# Patient Record
Sex: Female | Born: 1950 | Race: White | Hispanic: No | Marital: Married | Smoking: Former smoker
Health system: Southern US, Community
[De-identification: ages and names within clinical notes are randomized; demographics above are authoritative.]

## PROBLEM LIST (undated history)

## (undated) DIAGNOSIS — S4990XA Unspecified injury of shoulder and upper arm, unspecified arm, initial encounter: Secondary | ICD-10-CM

## (undated) DIAGNOSIS — E039 Hypothyroidism, unspecified: Secondary | ICD-10-CM

## (undated) DIAGNOSIS — E7800 Pure hypercholesterolemia, unspecified: Secondary | ICD-10-CM

## (undated) DIAGNOSIS — M810 Age-related osteoporosis without current pathological fracture: Secondary | ICD-10-CM

## (undated) DIAGNOSIS — C4491 Basal cell carcinoma of skin, unspecified: Secondary | ICD-10-CM

## (undated) HISTORY — DX: Basal cell carcinoma of skin, unspecified: C44.91

## (undated) HISTORY — PX: CATARACT EXTRACTION: SUR2

## (undated) HISTORY — DX: Unspecified injury of shoulder and upper arm, unspecified arm, initial encounter: S49.90XA

## (undated) HISTORY — DX: Hypothyroidism, unspecified: E03.9

## (undated) HISTORY — DX: Pure hypercholesterolemia, unspecified: E78.00

## (undated) HISTORY — DX: Age-related osteoporosis without current pathological fracture: M81.0

## (undated) HISTORY — PX: TONSILLECTOMY: SUR1361

---

## 1999-08-25 ENCOUNTER — Encounter: Admission: RE | Admit: 1999-08-25 | Discharge: 1999-08-25 | Payer: Self-pay | Admitting: Obstetrics and Gynecology

## 1999-08-25 ENCOUNTER — Encounter: Payer: Self-pay | Admitting: Obstetrics and Gynecology

## 2000-08-28 ENCOUNTER — Encounter: Payer: Self-pay | Admitting: Obstetrics and Gynecology

## 2000-08-28 ENCOUNTER — Encounter: Admission: RE | Admit: 2000-08-28 | Discharge: 2000-08-28 | Payer: Self-pay | Admitting: Obstetrics and Gynecology

## 2001-09-11 ENCOUNTER — Encounter: Payer: Self-pay | Admitting: Obstetrics and Gynecology

## 2001-09-11 ENCOUNTER — Encounter: Admission: RE | Admit: 2001-09-11 | Discharge: 2001-09-11 | Payer: Self-pay | Admitting: Obstetrics and Gynecology

## 2002-10-12 ENCOUNTER — Encounter: Payer: Self-pay | Admitting: Obstetrics and Gynecology

## 2002-10-12 ENCOUNTER — Ambulatory Visit (HOSPITAL_COMMUNITY): Admission: RE | Admit: 2002-10-12 | Discharge: 2002-10-12 | Payer: Self-pay | Admitting: Obstetrics and Gynecology

## 2003-10-26 ENCOUNTER — Ambulatory Visit (HOSPITAL_COMMUNITY): Admission: RE | Admit: 2003-10-26 | Discharge: 2003-10-26 | Payer: Self-pay | Admitting: Obstetrics and Gynecology

## 2004-12-05 ENCOUNTER — Ambulatory Visit (HOSPITAL_COMMUNITY): Admission: RE | Admit: 2004-12-05 | Discharge: 2004-12-05 | Payer: Self-pay | Admitting: Obstetrics and Gynecology

## 2005-12-26 ENCOUNTER — Ambulatory Visit (HOSPITAL_COMMUNITY): Admission: RE | Admit: 2005-12-26 | Discharge: 2005-12-26 | Payer: Self-pay | Admitting: Obstetrics and Gynecology

## 2007-01-01 ENCOUNTER — Ambulatory Visit (HOSPITAL_COMMUNITY): Admission: RE | Admit: 2007-01-01 | Discharge: 2007-01-01 | Payer: Self-pay | Admitting: Obstetrics and Gynecology

## 2008-01-07 ENCOUNTER — Ambulatory Visit (HOSPITAL_COMMUNITY): Admission: RE | Admit: 2008-01-07 | Discharge: 2008-01-07 | Payer: Self-pay | Admitting: Obstetrics and Gynecology

## 2009-01-06 ENCOUNTER — Ambulatory Visit (HOSPITAL_COMMUNITY): Admission: RE | Admit: 2009-01-06 | Discharge: 2009-01-06 | Payer: Self-pay | Admitting: Family Medicine

## 2009-06-08 ENCOUNTER — Encounter: Admission: RE | Admit: 2009-06-08 | Discharge: 2009-06-08 | Payer: Self-pay | Admitting: Family Medicine

## 2010-01-10 ENCOUNTER — Ambulatory Visit (HOSPITAL_COMMUNITY): Admission: RE | Admit: 2010-01-10 | Discharge: 2010-01-10 | Payer: Self-pay | Admitting: Internal Medicine

## 2010-05-21 ENCOUNTER — Encounter: Payer: Self-pay | Admitting: Obstetrics and Gynecology

## 2010-12-25 ENCOUNTER — Other Ambulatory Visit (HOSPITAL_COMMUNITY): Payer: Self-pay | Admitting: Family Medicine

## 2010-12-25 DIAGNOSIS — Z1231 Encounter for screening mammogram for malignant neoplasm of breast: Secondary | ICD-10-CM

## 2011-01-18 ENCOUNTER — Ambulatory Visit (HOSPITAL_COMMUNITY): Payer: Self-pay

## 2011-01-23 ENCOUNTER — Ambulatory Visit (HOSPITAL_COMMUNITY)
Admission: RE | Admit: 2011-01-23 | Discharge: 2011-01-23 | Disposition: A | Discharge status: Home / Self Care | Payer: 59 | Source: Ambulatory Visit | Attending: Family Medicine | Admitting: Family Medicine

## 2011-01-23 DIAGNOSIS — Z1231 Encounter for screening mammogram for malignant neoplasm of breast: Secondary | ICD-10-CM | POA: Insufficient documentation

## 2011-01-29 ENCOUNTER — Other Ambulatory Visit: Payer: Self-pay | Admitting: Family Medicine

## 2011-01-29 DIAGNOSIS — R928 Other abnormal and inconclusive findings on diagnostic imaging of breast: Secondary | ICD-10-CM

## 2011-02-07 ENCOUNTER — Ambulatory Visit
Admission: RE | Admit: 2011-02-07 | Discharge: 2011-02-07 | Disposition: A | Discharge status: Home / Self Care | Payer: 59 | Source: Ambulatory Visit | Attending: Family Medicine | Admitting: Family Medicine

## 2011-02-07 ENCOUNTER — Other Ambulatory Visit: Payer: Self-pay | Admitting: Family Medicine

## 2011-02-07 DIAGNOSIS — R928 Other abnormal and inconclusive findings on diagnostic imaging of breast: Secondary | ICD-10-CM

## 2011-12-19 ENCOUNTER — Other Ambulatory Visit: Payer: Self-pay | Admitting: Family Medicine

## 2011-12-19 DIAGNOSIS — Z1231 Encounter for screening mammogram for malignant neoplasm of breast: Secondary | ICD-10-CM

## 2012-01-24 ENCOUNTER — Ambulatory Visit
Admission: RE | Admit: 2012-01-24 | Discharge: 2012-01-24 | Disposition: A | Discharge status: Home / Self Care | Payer: 59 | Source: Ambulatory Visit | Attending: Family Medicine | Admitting: Family Medicine

## 2012-01-24 DIAGNOSIS — Z1231 Encounter for screening mammogram for malignant neoplasm of breast: Secondary | ICD-10-CM

## 2012-05-22 ENCOUNTER — Other Ambulatory Visit: Payer: Self-pay | Admitting: Family Medicine

## 2012-05-22 DIAGNOSIS — Z780 Asymptomatic menopausal state: Secondary | ICD-10-CM

## 2012-05-28 ENCOUNTER — Other Ambulatory Visit: Payer: Self-pay

## 2012-06-02 ENCOUNTER — Ambulatory Visit
Admission: RE | Admit: 2012-06-02 | Discharge: 2012-06-02 | Disposition: A | Discharge status: Home / Self Care | Payer: Self-pay | Source: Ambulatory Visit | Attending: Family Medicine | Admitting: Family Medicine

## 2012-06-02 DIAGNOSIS — Z780 Asymptomatic menopausal state: Secondary | ICD-10-CM

## 2012-12-22 ENCOUNTER — Other Ambulatory Visit: Payer: Self-pay

## 2012-12-22 DIAGNOSIS — Z1231 Encounter for screening mammogram for malignant neoplasm of breast: Secondary | ICD-10-CM

## 2013-01-26 ENCOUNTER — Ambulatory Visit
Admission: RE | Admit: 2013-01-26 | Discharge: 2013-01-26 | Disposition: A | Discharge status: Home / Self Care | Payer: 59 | Source: Ambulatory Visit

## 2013-01-26 DIAGNOSIS — Z1231 Encounter for screening mammogram for malignant neoplasm of breast: Secondary | ICD-10-CM

## 2013-12-29 ENCOUNTER — Other Ambulatory Visit: Payer: Self-pay

## 2013-12-29 DIAGNOSIS — Z1231 Encounter for screening mammogram for malignant neoplasm of breast: Secondary | ICD-10-CM

## 2014-01-08 ENCOUNTER — Ambulatory Visit
Admission: RE | Admit: 2014-01-08 | Discharge: 2014-01-08 | Disposition: A | Discharge status: Home / Self Care | Payer: 59 | Source: Ambulatory Visit

## 2014-01-08 DIAGNOSIS — Z1231 Encounter for screening mammogram for malignant neoplasm of breast: Secondary | ICD-10-CM

## 2014-07-22 ENCOUNTER — Other Ambulatory Visit: Payer: Self-pay | Admitting: Family Medicine

## 2014-07-22 DIAGNOSIS — M8580 Other specified disorders of bone density and structure, unspecified site: Secondary | ICD-10-CM

## 2014-08-04 ENCOUNTER — Ambulatory Visit
Admission: RE | Admit: 2014-08-04 | Discharge: 2014-08-04 | Disposition: A | Discharge status: Home / Self Care | Payer: 59 | Source: Ambulatory Visit | Attending: Family Medicine | Admitting: Family Medicine

## 2014-08-04 DIAGNOSIS — M8580 Other specified disorders of bone density and structure, unspecified site: Secondary | ICD-10-CM

## 2014-12-30 ENCOUNTER — Other Ambulatory Visit: Payer: Self-pay

## 2014-12-30 DIAGNOSIS — Z1231 Encounter for screening mammogram for malignant neoplasm of breast: Secondary | ICD-10-CM

## 2015-02-07 ENCOUNTER — Ambulatory Visit
Admission: RE | Admit: 2015-02-07 | Discharge: 2015-02-07 | Disposition: A | Discharge status: Home / Self Care | Payer: 59 | Source: Ambulatory Visit

## 2015-02-07 DIAGNOSIS — Z1231 Encounter for screening mammogram for malignant neoplasm of breast: Secondary | ICD-10-CM

## 2015-05-17 DIAGNOSIS — Z6829 Body mass index (BMI) 29.0-29.9, adult: Secondary | ICD-10-CM | POA: Diagnosis not present

## 2015-05-17 DIAGNOSIS — E559 Vitamin D deficiency, unspecified: Secondary | ICD-10-CM | POA: Diagnosis not present

## 2015-05-17 DIAGNOSIS — E7800 Pure hypercholesterolemia, unspecified: Secondary | ICD-10-CM | POA: Diagnosis not present

## 2015-05-17 DIAGNOSIS — E039 Hypothyroidism, unspecified: Secondary | ICD-10-CM | POA: Diagnosis not present

## 2015-06-14 MED FILL — PRAVASTATIN NA 40 MG TAB: 40 | 90 days supply | Qty: 90 | Fill #3

## 2015-06-14 MED FILL — LEVOTHYROXINE 50 MCG TABLET: 50 | 90 days supply | Qty: 90 | Fill #3

## 2015-07-07 DIAGNOSIS — Z85828 Personal history of other malignant neoplasm of skin: Secondary | ICD-10-CM | POA: Diagnosis not present

## 2015-07-07 DIAGNOSIS — L820 Inflamed seborrheic keratosis: Secondary | ICD-10-CM | POA: Diagnosis not present

## 2015-09-21 MED FILL — LEVOTHYROXINE 50 MCG TABLET: 50 | 90 days supply | Qty: 90 | Fill #0

## 2015-09-21 MED FILL — PRAVASTATIN NA 40 MG TAB: 40 | 90 days supply | Qty: 90 | Fill #0

## 2015-12-19 MED FILL — LEVOTHYROXINE 50 MCG TABLET: 50 | 90 days supply | Qty: 90 | Fill #1

## 2015-12-19 MED FILL — PRAVASTATIN NA 40 MG TAB: 40 | 90 days supply | Qty: 90 | Fill #1

## 2016-01-18 ENCOUNTER — Other Ambulatory Visit: Payer: Self-pay | Admitting: Family Medicine

## 2016-01-18 DIAGNOSIS — Z1231 Encounter for screening mammogram for malignant neoplasm of breast: Secondary | ICD-10-CM

## 2016-02-13 ENCOUNTER — Ambulatory Visit
Admission: RE | Admit: 2016-02-13 | Discharge: 2016-02-13 | Disposition: A | Discharge status: Home / Self Care | Payer: 59 | Source: Ambulatory Visit | Attending: Family Medicine | Admitting: Family Medicine

## 2016-02-13 ENCOUNTER — Other Ambulatory Visit: Payer: Self-pay | Admitting: Nurse Practitioner

## 2016-02-13 DIAGNOSIS — Z1231 Encounter for screening mammogram for malignant neoplasm of breast: Secondary | ICD-10-CM

## 2016-03-02 DIAGNOSIS — D1801 Hemangioma of skin and subcutaneous tissue: Secondary | ICD-10-CM | POA: Diagnosis not present

## 2016-03-02 DIAGNOSIS — L821 Other seborrheic keratosis: Secondary | ICD-10-CM | POA: Diagnosis not present

## 2016-03-02 DIAGNOSIS — Z85828 Personal history of other malignant neoplasm of skin: Secondary | ICD-10-CM | POA: Diagnosis not present

## 2016-03-02 DIAGNOSIS — D225 Melanocytic nevi of trunk: Secondary | ICD-10-CM | POA: Diagnosis not present

## 2016-03-02 DIAGNOSIS — L814 Other melanin hyperpigmentation: Secondary | ICD-10-CM | POA: Diagnosis not present

## 2016-03-19 MED FILL — PRAVASTATIN NA 40 MG TAB: 40 | 90 days supply | Qty: 90 | Fill #2

## 2016-03-19 MED FILL — LEVOTHYROXINE 50 MCG TABLET: 50 | 90 days supply | Qty: 90 | Fill #2

## 2016-05-21 DIAGNOSIS — E559 Vitamin D deficiency, unspecified: Secondary | ICD-10-CM | POA: Diagnosis not present

## 2016-05-21 DIAGNOSIS — M8580 Other specified disorders of bone density and structure, unspecified site: Secondary | ICD-10-CM | POA: Diagnosis not present

## 2016-05-21 DIAGNOSIS — Z9181 History of falling: Secondary | ICD-10-CM | POA: Diagnosis not present

## 2016-05-21 DIAGNOSIS — Z1389 Encounter for screening for other disorder: Secondary | ICD-10-CM | POA: Diagnosis not present

## 2016-05-21 DIAGNOSIS — E039 Hypothyroidism, unspecified: Secondary | ICD-10-CM | POA: Diagnosis not present

## 2016-05-21 DIAGNOSIS — E7800 Pure hypercholesterolemia, unspecified: Secondary | ICD-10-CM | POA: Diagnosis not present

## 2016-05-21 DIAGNOSIS — E663 Overweight: Secondary | ICD-10-CM | POA: Diagnosis not present

## 2016-05-21 DIAGNOSIS — Z79899 Other long term (current) drug therapy: Secondary | ICD-10-CM | POA: Diagnosis not present

## 2016-05-21 DIAGNOSIS — Z6827 Body mass index (BMI) 27.0-27.9, adult: Secondary | ICD-10-CM | POA: Diagnosis not present

## 2016-06-07 DIAGNOSIS — H5213 Myopia, bilateral: Secondary | ICD-10-CM | POA: Diagnosis not present

## 2016-06-18 MED FILL — PRAVASTATIN NA 40 MG TAB: 40 | 90 days supply | Qty: 90 | Fill #3

## 2016-06-18 MED FILL — LEVOTHYROXINE 50 MCG TABLET: 50 | 90 days supply | Qty: 90 | Fill #3

## 2016-09-18 MED FILL — LEVOTHYROXINE 50 MCG TABLET: 50 | 90 days supply | Qty: 90 | Fill #0

## 2016-09-18 MED FILL — PRAVASTATIN NA 40 MG TAB: 40 | 90 days supply | Qty: 90 | Fill #0

## 2016-11-23 ENCOUNTER — Other Ambulatory Visit: Payer: Self-pay | Admitting: Nurse Practitioner

## 2016-11-23 DIAGNOSIS — E039 Hypothyroidism, unspecified: Secondary | ICD-10-CM | POA: Diagnosis not present

## 2016-11-23 DIAGNOSIS — Z6825 Body mass index (BMI) 25.0-25.9, adult: Secondary | ICD-10-CM | POA: Diagnosis not present

## 2016-11-23 DIAGNOSIS — E7800 Pure hypercholesterolemia, unspecified: Secondary | ICD-10-CM | POA: Diagnosis not present

## 2016-11-23 DIAGNOSIS — E663 Overweight: Secondary | ICD-10-CM | POA: Diagnosis not present

## 2016-11-23 DIAGNOSIS — M8580 Other specified disorders of bone density and structure, unspecified site: Secondary | ICD-10-CM

## 2016-11-23 DIAGNOSIS — E559 Vitamin D deficiency, unspecified: Secondary | ICD-10-CM | POA: Diagnosis not present

## 2016-12-17 MED FILL — PRAVASTATIN NA 40 MG TAB: 40 | 90 days supply | Qty: 90 | Fill #1

## 2016-12-17 MED FILL — LEVOTHYROXINE 50 MCG TABLET: 50 | 90 days supply | Qty: 90 | Fill #1

## 2017-02-13 ENCOUNTER — Ambulatory Visit
Admission: RE | Admit: 2017-02-13 | Discharge: 2017-02-13 | Disposition: A | Discharge status: Home / Self Care | Payer: 59 | Source: Ambulatory Visit | Attending: Nurse Practitioner | Admitting: Nurse Practitioner

## 2017-02-13 DIAGNOSIS — M810 Age-related osteoporosis without current pathological fracture: Secondary | ICD-10-CM | POA: Diagnosis not present

## 2017-02-13 DIAGNOSIS — Z780 Asymptomatic menopausal state: Secondary | ICD-10-CM | POA: Diagnosis not present

## 2017-02-13 DIAGNOSIS — Z1231 Encounter for screening mammogram for malignant neoplasm of breast: Secondary | ICD-10-CM | POA: Diagnosis not present

## 2017-02-13 DIAGNOSIS — M8580 Other specified disorders of bone density and structure, unspecified site: Secondary | ICD-10-CM

## 2017-03-01 DIAGNOSIS — E663 Overweight: Secondary | ICD-10-CM | POA: Diagnosis not present

## 2017-03-01 DIAGNOSIS — E039 Hypothyroidism, unspecified: Secondary | ICD-10-CM | POA: Diagnosis not present

## 2017-03-01 DIAGNOSIS — Z6825 Body mass index (BMI) 25.0-25.9, adult: Secondary | ICD-10-CM | POA: Diagnosis not present

## 2017-03-01 DIAGNOSIS — M810 Age-related osteoporosis without current pathological fracture: Secondary | ICD-10-CM | POA: Diagnosis not present

## 2017-03-01 MED FILL — ALENDRONATE NA 70 MG TAB: 70 | 28 days supply | Qty: 4 | Fill #0

## 2017-03-07 DIAGNOSIS — D1801 Hemangioma of skin and subcutaneous tissue: Secondary | ICD-10-CM | POA: Diagnosis not present

## 2017-03-07 DIAGNOSIS — L820 Inflamed seborrheic keratosis: Secondary | ICD-10-CM | POA: Diagnosis not present

## 2017-03-07 DIAGNOSIS — L814 Other melanin hyperpigmentation: Secondary | ICD-10-CM | POA: Diagnosis not present

## 2017-03-07 DIAGNOSIS — Z85828 Personal history of other malignant neoplasm of skin: Secondary | ICD-10-CM | POA: Diagnosis not present

## 2017-03-07 DIAGNOSIS — L821 Other seborrheic keratosis: Secondary | ICD-10-CM | POA: Diagnosis not present

## 2017-03-07 DIAGNOSIS — D225 Melanocytic nevi of trunk: Secondary | ICD-10-CM | POA: Diagnosis not present

## 2017-03-18 MED FILL — LEVOTHYROXINE 50 MCG TABLET: 50 | 90 days supply | Qty: 90 | Fill #2

## 2017-03-18 MED FILL — PRAVASTATIN NA 40 MG TAB: 40 | 90 days supply | Qty: 90 | Fill #2

## 2017-04-08 MED FILL — ALENDRONATE NA 70 MG TAB: 70 | 28 days supply | Qty: 4 | Fill #1

## 2017-05-06 MED FILL — ALENDRONATE NA 70 MG TAB: 70 | 28 days supply | Qty: 4 | Fill #2

## 2017-05-24 DIAGNOSIS — Z6826 Body mass index (BMI) 26.0-26.9, adult: Secondary | ICD-10-CM | POA: Diagnosis not present

## 2017-05-24 DIAGNOSIS — M810 Age-related osteoporosis without current pathological fracture: Secondary | ICD-10-CM | POA: Diagnosis not present

## 2017-05-24 DIAGNOSIS — E039 Hypothyroidism, unspecified: Secondary | ICD-10-CM | POA: Diagnosis not present

## 2017-05-24 DIAGNOSIS — E7800 Pure hypercholesterolemia, unspecified: Secondary | ICD-10-CM | POA: Diagnosis not present

## 2017-05-24 DIAGNOSIS — Z1331 Encounter for screening for depression: Secondary | ICD-10-CM | POA: Diagnosis not present

## 2017-05-24 DIAGNOSIS — E559 Vitamin D deficiency, unspecified: Secondary | ICD-10-CM | POA: Diagnosis not present

## 2017-06-03 MED FILL — ALENDRONATE NA 70 MG TAB: 70 | 84 days supply | Qty: 12 | Fill #0

## 2017-06-13 MED FILL — PRAVASTATIN NA 40 MG TAB: 40 | 90 days supply | Qty: 90 | Fill #3

## 2017-06-13 MED FILL — LEVOTHYROXINE 50 MCG TABLET: 50 | 90 days supply | Qty: 90 | Fill #3

## 2017-09-10 MED FILL — ALENDRONATE NA 70 MG TAB: 70 | 84 days supply | Qty: 12 | Fill #1

## 2017-09-10 MED FILL — LEVOTHYROXINE 50 MCG TABLET: 50 | 90 days supply | Qty: 90 | Fill #0

## 2017-09-10 MED FILL — PRAVASTATIN NA 40 MG TAB: 40 | 90 days supply | Qty: 90 | Fill #0

## 2017-10-14 DIAGNOSIS — Z6826 Body mass index (BMI) 26.0-26.9, adult: Secondary | ICD-10-CM | POA: Diagnosis not present

## 2017-10-14 DIAGNOSIS — Z0000 Encounter for general adult medical examination without abnormal findings: Secondary | ICD-10-CM | POA: Diagnosis not present

## 2017-10-21 DIAGNOSIS — H5213 Myopia, bilateral: Secondary | ICD-10-CM | POA: Diagnosis not present

## 2017-12-02 MED FILL — LEVOTHYROXINE 50 MCG TABLET: 50 | 90 days supply | Qty: 90 | Fill #1

## 2017-12-02 MED FILL — PRAVASTATIN NA 40 MG TAB: 40 | 90 days supply | Qty: 90 | Fill #1

## 2017-12-02 MED FILL — ALENDRONATE NA 70 MG TAB: 70 | 84 days supply | Qty: 12 | Fill #2

## 2017-12-03 MED FILL — FLUOCINONIDE 0.05 % SOLN: 0.05 | 30 days supply | Qty: 60 | Fill #0

## 2018-01-07 ENCOUNTER — Other Ambulatory Visit: Payer: Self-pay | Admitting: Nurse Practitioner

## 2018-01-07 DIAGNOSIS — Z1231 Encounter for screening mammogram for malignant neoplasm of breast: Secondary | ICD-10-CM

## 2018-02-14 ENCOUNTER — Ambulatory Visit
Admission: RE | Admit: 2018-02-14 | Discharge: 2018-02-14 | Disposition: A | Discharge status: Home / Self Care | Payer: 59 | Source: Ambulatory Visit | Attending: Nurse Practitioner | Admitting: Nurse Practitioner

## 2018-02-14 DIAGNOSIS — Z1231 Encounter for screening mammogram for malignant neoplasm of breast: Secondary | ICD-10-CM | POA: Diagnosis not present

## 2018-02-18 ENCOUNTER — Other Ambulatory Visit: Payer: Self-pay | Admitting: Nurse Practitioner

## 2018-02-18 DIAGNOSIS — R928 Other abnormal and inconclusive findings on diagnostic imaging of breast: Secondary | ICD-10-CM

## 2018-02-18 DIAGNOSIS — N6310 Unspecified lump in the right breast, unspecified quadrant: Secondary | ICD-10-CM

## 2018-02-24 MED FILL — LEVOTHYROXINE 50 MCG TABLET: 50 | 90 days supply | Qty: 90 | Fill #2

## 2018-02-24 MED FILL — ALENDRONATE NA 70 MG TAB: 70 | 84 days supply | Qty: 12 | Fill #3

## 2018-02-24 MED FILL — PRAVASTATIN NA 40 MG TAB: 40 | 90 days supply | Qty: 90 | Fill #2

## 2018-02-26 ENCOUNTER — Ambulatory Visit
Admission: RE | Admit: 2018-02-26 | Discharge: 2018-02-26 | Disposition: A | Discharge status: Home / Self Care | Payer: 59 | Source: Ambulatory Visit | Attending: Nurse Practitioner | Admitting: Nurse Practitioner

## 2018-02-26 ENCOUNTER — Ambulatory Visit: Payer: 59

## 2018-02-26 DIAGNOSIS — R928 Other abnormal and inconclusive findings on diagnostic imaging of breast: Secondary | ICD-10-CM | POA: Diagnosis not present

## 2018-03-13 DIAGNOSIS — L814 Other melanin hyperpigmentation: Secondary | ICD-10-CM | POA: Diagnosis not present

## 2018-03-13 DIAGNOSIS — L570 Actinic keratosis: Secondary | ICD-10-CM | POA: Diagnosis not present

## 2018-03-13 DIAGNOSIS — D485 Neoplasm of uncertain behavior of skin: Secondary | ICD-10-CM | POA: Diagnosis not present

## 2018-03-13 DIAGNOSIS — L821 Other seborrheic keratosis: Secondary | ICD-10-CM | POA: Diagnosis not present

## 2018-03-13 DIAGNOSIS — Z85828 Personal history of other malignant neoplasm of skin: Secondary | ICD-10-CM | POA: Diagnosis not present

## 2018-04-17 DIAGNOSIS — E039 Hypothyroidism, unspecified: Secondary | ICD-10-CM | POA: Diagnosis not present

## 2018-04-17 DIAGNOSIS — M810 Age-related osteoporosis without current pathological fracture: Secondary | ICD-10-CM | POA: Diagnosis not present

## 2018-04-17 DIAGNOSIS — Z1212 Encounter for screening for malignant neoplasm of rectum: Secondary | ICD-10-CM | POA: Diagnosis not present

## 2018-04-17 DIAGNOSIS — E7800 Pure hypercholesterolemia, unspecified: Secondary | ICD-10-CM | POA: Diagnosis not present

## 2018-04-17 DIAGNOSIS — E559 Vitamin D deficiency, unspecified: Secondary | ICD-10-CM | POA: Diagnosis not present

## 2018-04-17 DIAGNOSIS — D649 Anemia, unspecified: Secondary | ICD-10-CM | POA: Diagnosis not present

## 2018-04-17 DIAGNOSIS — Z6828 Body mass index (BMI) 28.0-28.9, adult: Secondary | ICD-10-CM | POA: Diagnosis not present

## 2018-12-09 DIAGNOSIS — D649 Anemia, unspecified: Secondary | ICD-10-CM | POA: Diagnosis not present

## 2018-12-09 DIAGNOSIS — E039 Hypothyroidism, unspecified: Secondary | ICD-10-CM | POA: Diagnosis not present

## 2018-12-09 DIAGNOSIS — Z1331 Encounter for screening for depression: Secondary | ICD-10-CM | POA: Diagnosis not present

## 2018-12-09 DIAGNOSIS — E559 Vitamin D deficiency, unspecified: Secondary | ICD-10-CM | POA: Diagnosis not present

## 2018-12-09 DIAGNOSIS — M810 Age-related osteoporosis without current pathological fracture: Secondary | ICD-10-CM | POA: Diagnosis not present

## 2018-12-09 DIAGNOSIS — Z9181 History of falling: Secondary | ICD-10-CM | POA: Diagnosis not present

## 2018-12-09 DIAGNOSIS — Z6829 Body mass index (BMI) 29.0-29.9, adult: Secondary | ICD-10-CM | POA: Diagnosis not present

## 2018-12-09 DIAGNOSIS — E7800 Pure hypercholesterolemia, unspecified: Secondary | ICD-10-CM | POA: Diagnosis not present

## 2018-12-18 DIAGNOSIS — L570 Actinic keratosis: Secondary | ICD-10-CM | POA: Diagnosis not present

## 2018-12-18 DIAGNOSIS — L814 Other melanin hyperpigmentation: Secondary | ICD-10-CM | POA: Diagnosis not present

## 2018-12-18 DIAGNOSIS — L821 Other seborrheic keratosis: Secondary | ICD-10-CM | POA: Diagnosis not present

## 2018-12-18 DIAGNOSIS — Z85828 Personal history of other malignant neoplasm of skin: Secondary | ICD-10-CM | POA: Diagnosis not present

## 2019-02-03 ENCOUNTER — Other Ambulatory Visit: Payer: Self-pay | Admitting: Nurse Practitioner

## 2019-02-03 DIAGNOSIS — Z1231 Encounter for screening mammogram for malignant neoplasm of breast: Secondary | ICD-10-CM

## 2019-03-04 ENCOUNTER — Other Ambulatory Visit: Payer: Self-pay

## 2019-03-04 ENCOUNTER — Ambulatory Visit
Admission: RE | Admit: 2019-03-04 | Discharge: 2019-03-04 | Disposition: A | Discharge status: Home / Self Care | Payer: Medicare HMO | Source: Ambulatory Visit | Attending: Nurse Practitioner | Admitting: Nurse Practitioner

## 2019-03-04 DIAGNOSIS — Z1231 Encounter for screening mammogram for malignant neoplasm of breast: Secondary | ICD-10-CM

## 2019-04-01 DIAGNOSIS — R69 Illness, unspecified: Secondary | ICD-10-CM | POA: Diagnosis not present

## 2019-06-11 DIAGNOSIS — D649 Anemia, unspecified: Secondary | ICD-10-CM | POA: Diagnosis not present

## 2019-06-11 DIAGNOSIS — E785 Hyperlipidemia, unspecified: Secondary | ICD-10-CM | POA: Diagnosis not present

## 2019-06-11 DIAGNOSIS — E663 Overweight: Secondary | ICD-10-CM | POA: Diagnosis not present

## 2019-06-11 DIAGNOSIS — Z139 Encounter for screening, unspecified: Secondary | ICD-10-CM | POA: Diagnosis not present

## 2019-06-11 DIAGNOSIS — Z0000 Encounter for general adult medical examination without abnormal findings: Secondary | ICD-10-CM | POA: Diagnosis not present

## 2019-06-11 DIAGNOSIS — E559 Vitamin D deficiency, unspecified: Secondary | ICD-10-CM | POA: Diagnosis not present

## 2019-06-11 DIAGNOSIS — E7800 Pure hypercholesterolemia, unspecified: Secondary | ICD-10-CM | POA: Diagnosis not present

## 2019-06-11 DIAGNOSIS — E039 Hypothyroidism, unspecified: Secondary | ICD-10-CM | POA: Diagnosis not present

## 2019-06-11 DIAGNOSIS — Z23 Encounter for immunization: Secondary | ICD-10-CM | POA: Diagnosis not present

## 2019-06-11 DIAGNOSIS — Z9181 History of falling: Secondary | ICD-10-CM | POA: Diagnosis not present

## 2019-06-11 DIAGNOSIS — M810 Age-related osteoporosis without current pathological fracture: Secondary | ICD-10-CM | POA: Diagnosis not present

## 2019-06-11 DIAGNOSIS — Z1331 Encounter for screening for depression: Secondary | ICD-10-CM | POA: Diagnosis not present

## 2019-06-19 DIAGNOSIS — L570 Actinic keratosis: Secondary | ICD-10-CM | POA: Diagnosis not present

## 2019-06-19 DIAGNOSIS — L565 Disseminated superficial actinic porokeratosis (DSAP): Secondary | ICD-10-CM | POA: Diagnosis not present

## 2019-06-19 DIAGNOSIS — Z85828 Personal history of other malignant neoplasm of skin: Secondary | ICD-10-CM | POA: Diagnosis not present

## 2019-06-19 DIAGNOSIS — L820 Inflamed seborrheic keratosis: Secondary | ICD-10-CM | POA: Diagnosis not present

## 2019-06-21 IMAGING — MG DIGITAL SCREENING BILATERAL MAMMOGRAM WITH TOMO AND CAD
6 of 10 series · 6 of 30 positions shown · non-contrast
Comparison: Previous exam(s).

CLINICAL DATA: Screening.

EXAM:
DIGITAL SCREENING BILATERAL MAMMOGRAM WITH TOMO AND CAD

[R CC synth-2D (1 of 2)]
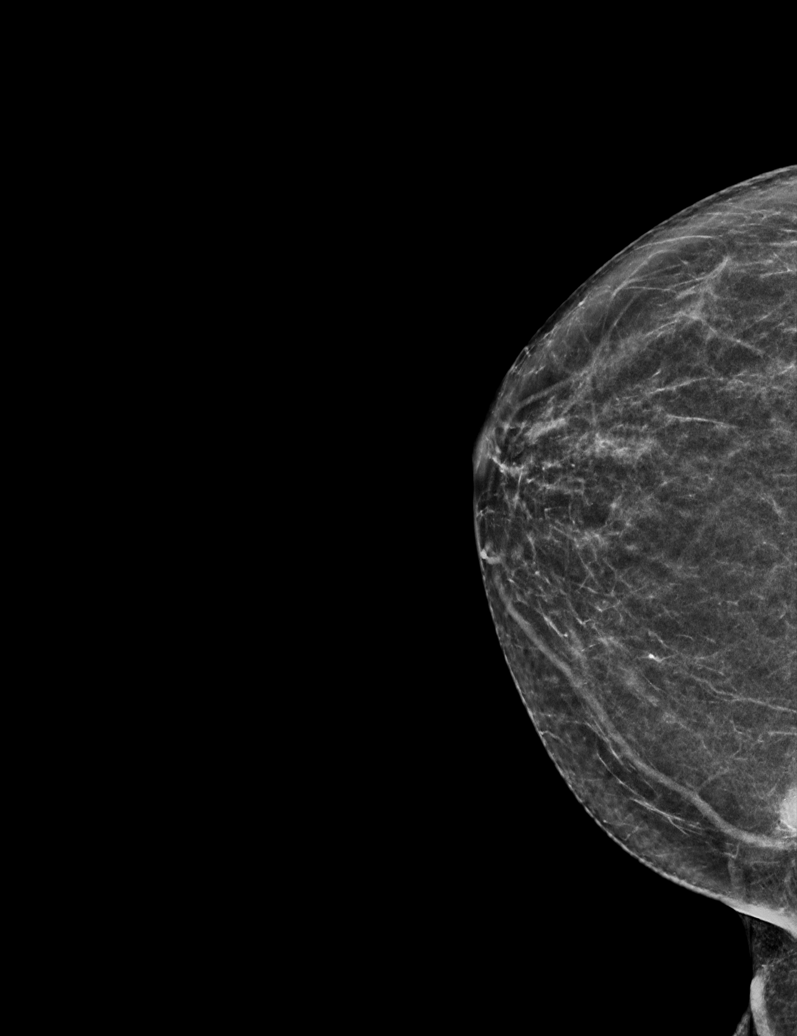

[R MLO synth-2D]
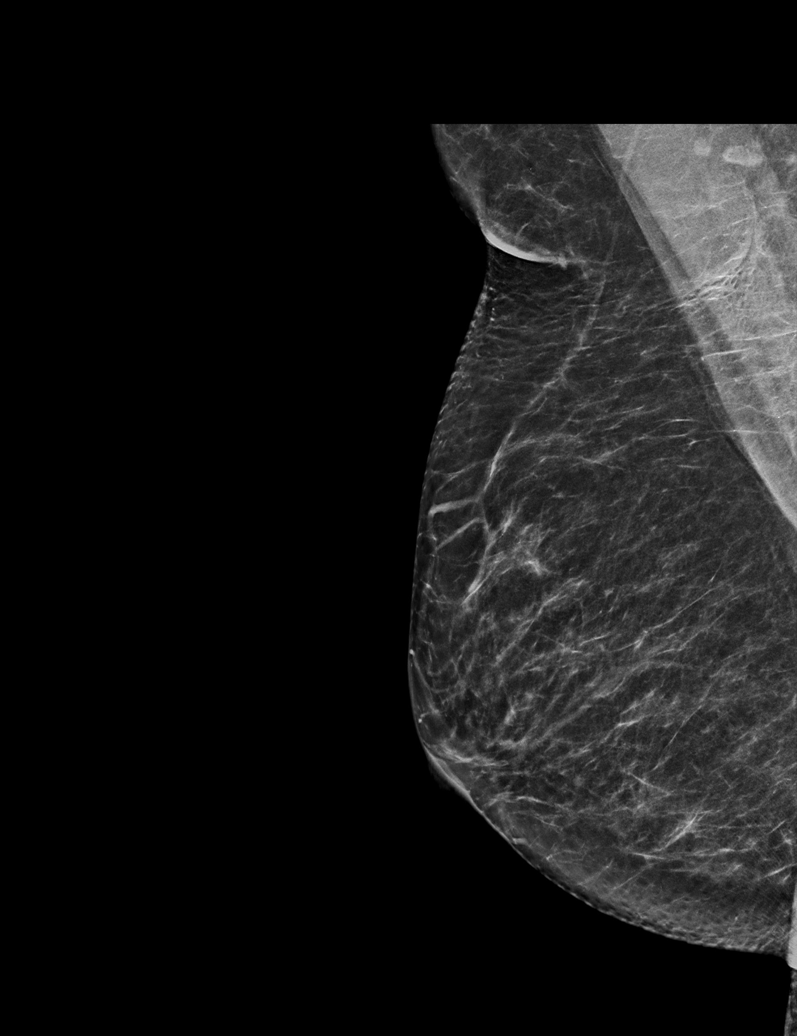

[L CC synth-2D]
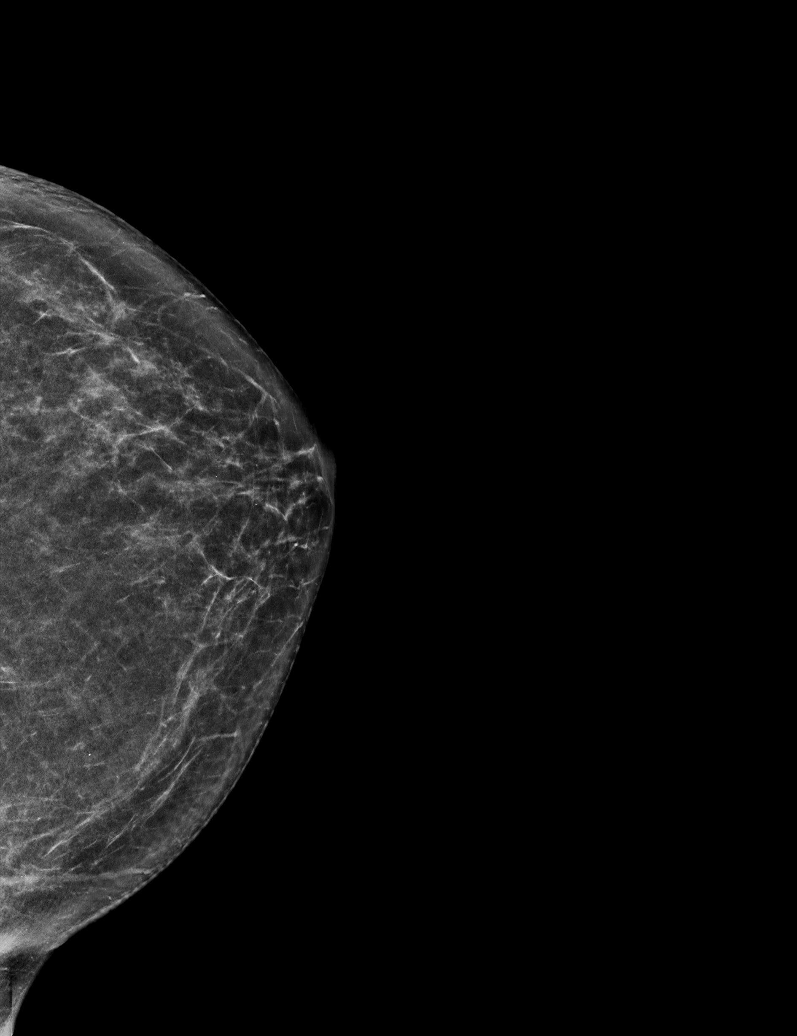

[L MLO synth-2D]
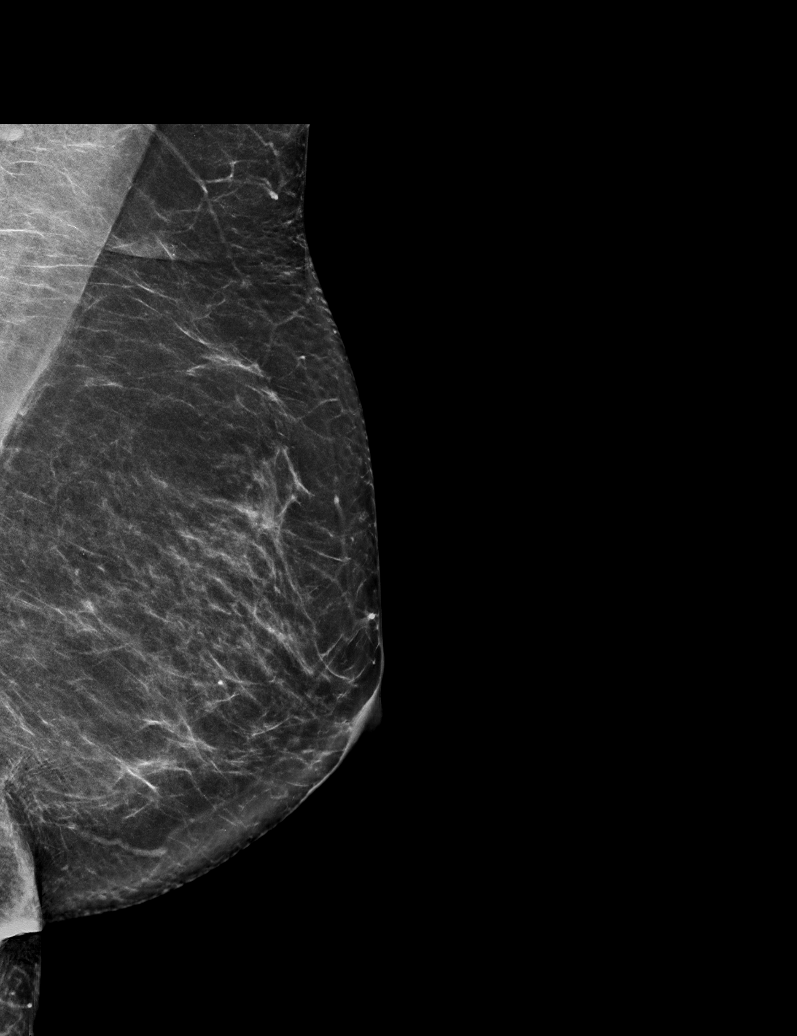

[R CC synth-2D (2 of 2)]
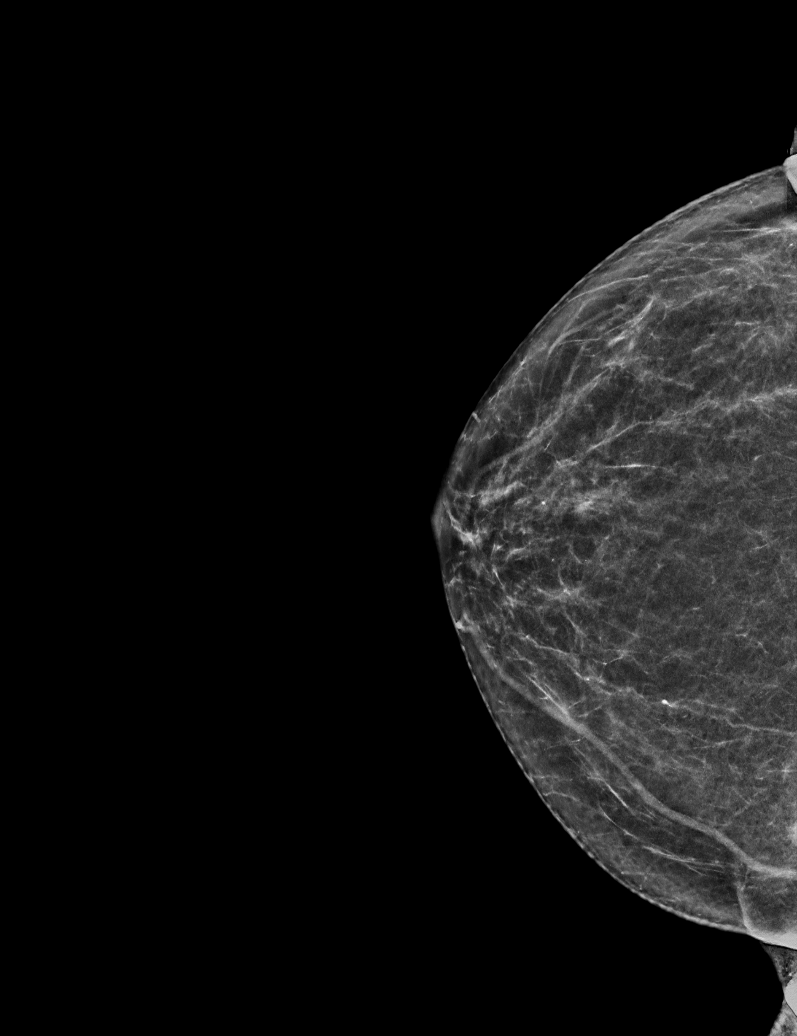

[R CC tomo · tomo slice 31/60.0]
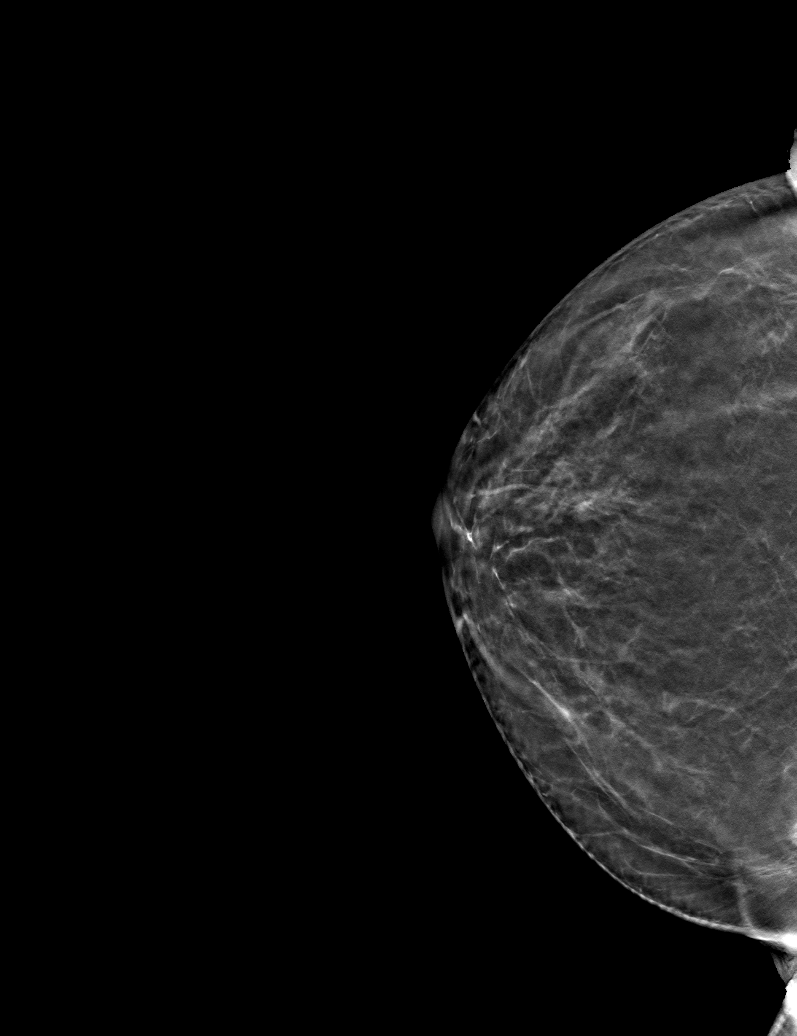

[6 of 30 positions shown; findings below may reference images not displayed]

ACR Breast Density Category b: There are scattered areas of
fibroglandular density.
FINDINGS: In the right breast, a possible mass warrants further evaluation. In
the left breast, no findings suspicious for malignancy. Images were
processed with CAD.
IMPRESSION: Further evaluation is suggested for possible mass in the right
breast.

RECOMMENDATION:
Diagnostic mammogram and possibly ultrasound of the right breast.
(Code:[id number]-550)

The patient will be contacted regarding the findings, and additional
imaging will be scheduled.

BI-RADS CATEGORY  0: Incomplete. Need additional imaging evaluation
and/or prior mammograms for comparison.

## 2019-06-26 DIAGNOSIS — Z1212 Encounter for screening for malignant neoplasm of rectum: Secondary | ICD-10-CM | POA: Diagnosis not present

## 2019-06-26 DIAGNOSIS — Z1211 Encounter for screening for malignant neoplasm of colon: Secondary | ICD-10-CM | POA: Diagnosis not present

## 2019-07-01 LAB — COLOGUARD
COLOGUARD: NEGATIVE
Cologuard: NEGATIVE

## 2019-07-01 LAB — EXTERNAL GENERIC LAB PROCEDURE: COLOGUARD: NEGATIVE

## 2019-07-03 IMAGING — MG DIGITAL DIAGNOSTIC UNILATERAL RIGHT MAMMOGRAM WITH TOMO AND CAD
4 series · 4 of 12 positions shown · non-contrast
Comparison: 02/14/2018 and earlier

CLINICAL DATA: The patient returns after screening study for
evaluation of possible RIGHT breast mass.

EXAM:
DIGITAL DIAGNOSTIC UNILATERAL RIGHT MAMMOGRAM WITH CAD AND TOMO

[R ML synth-2D]
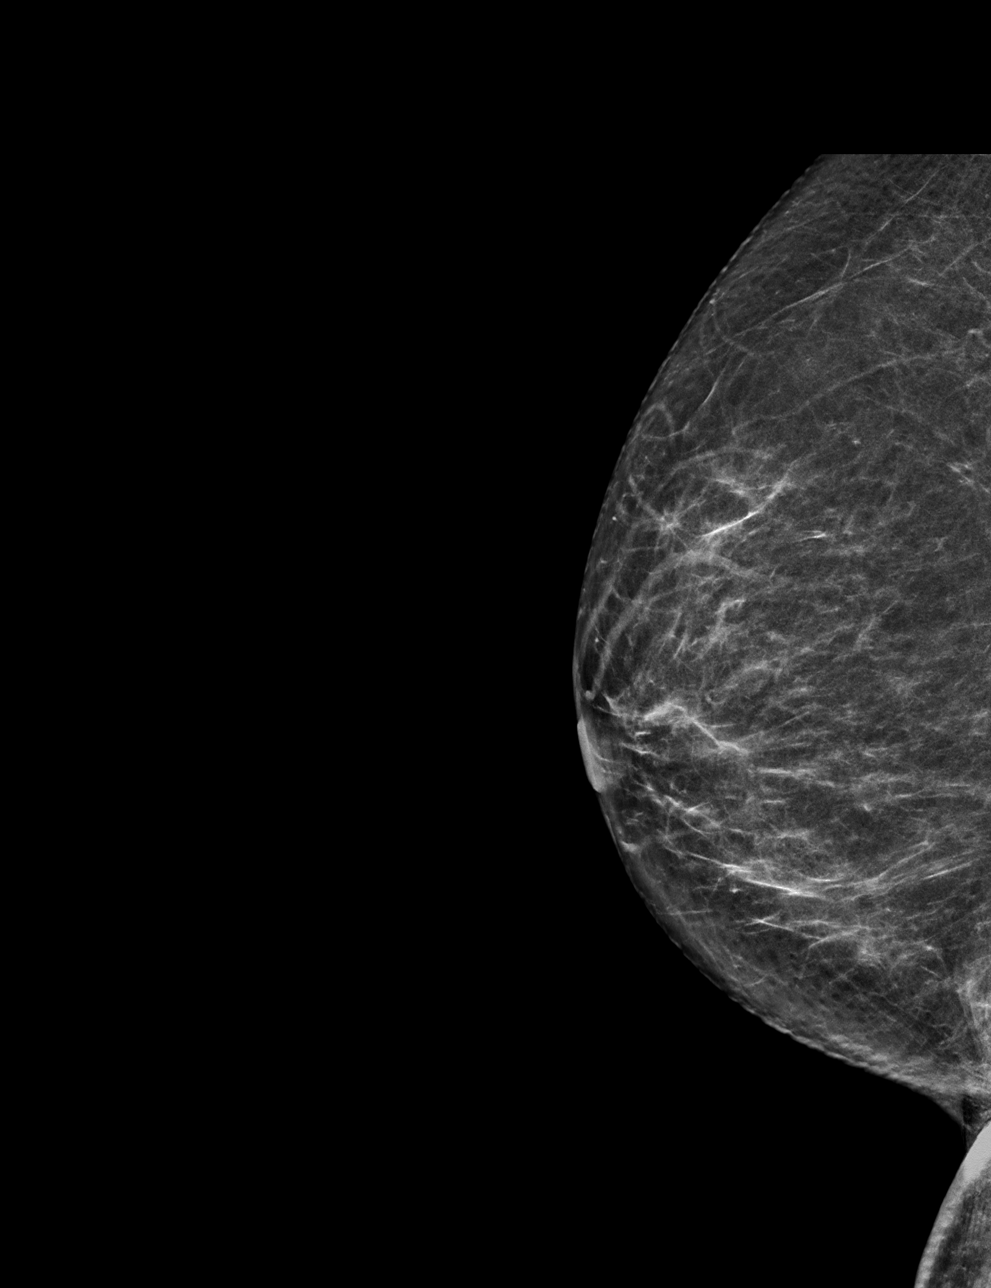

[R CC synth-2D]
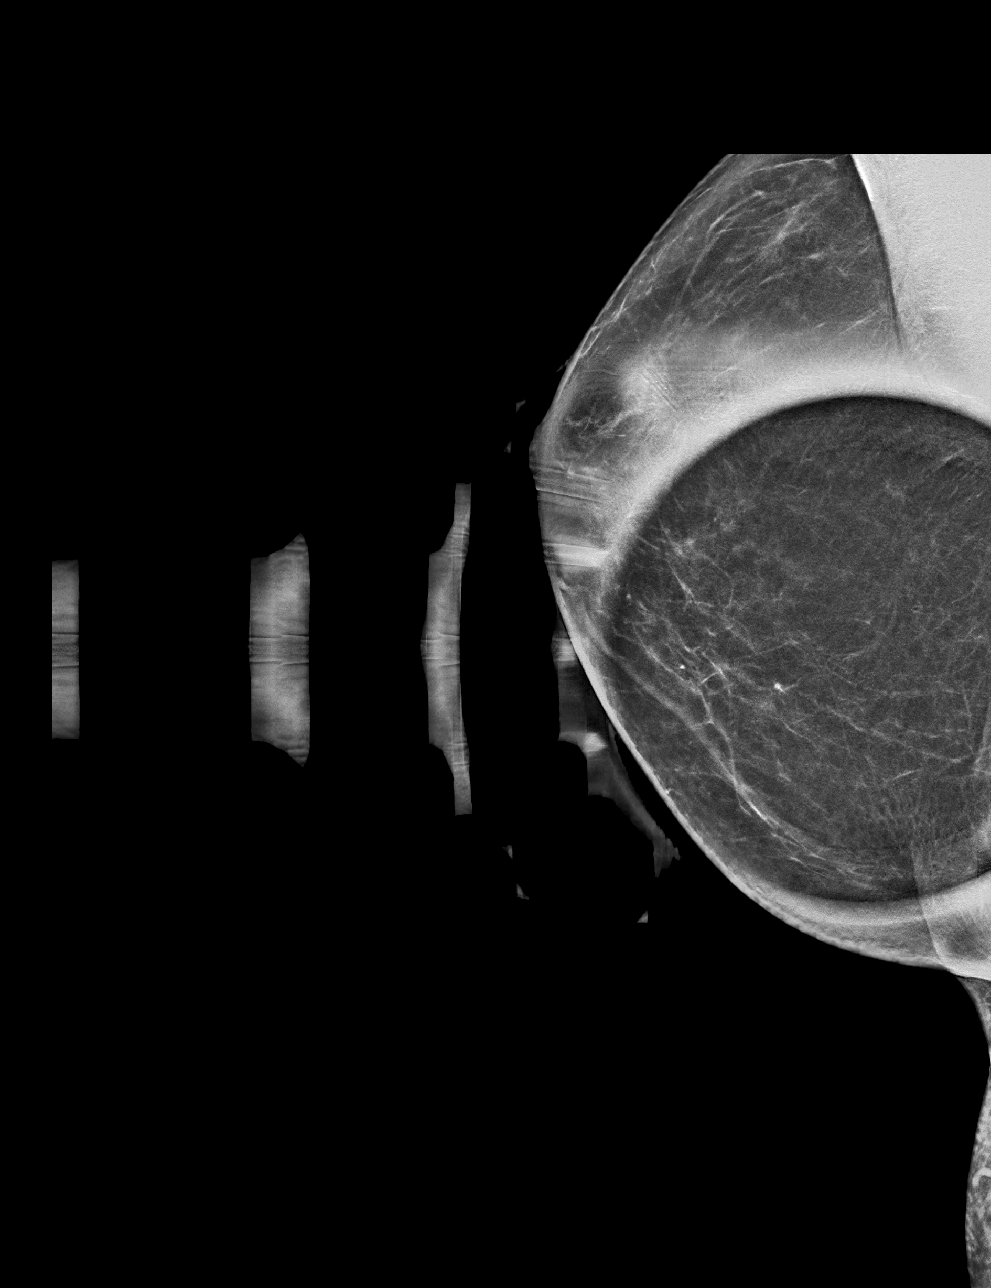

[R CC tomo · tomo slice 31/61.0]
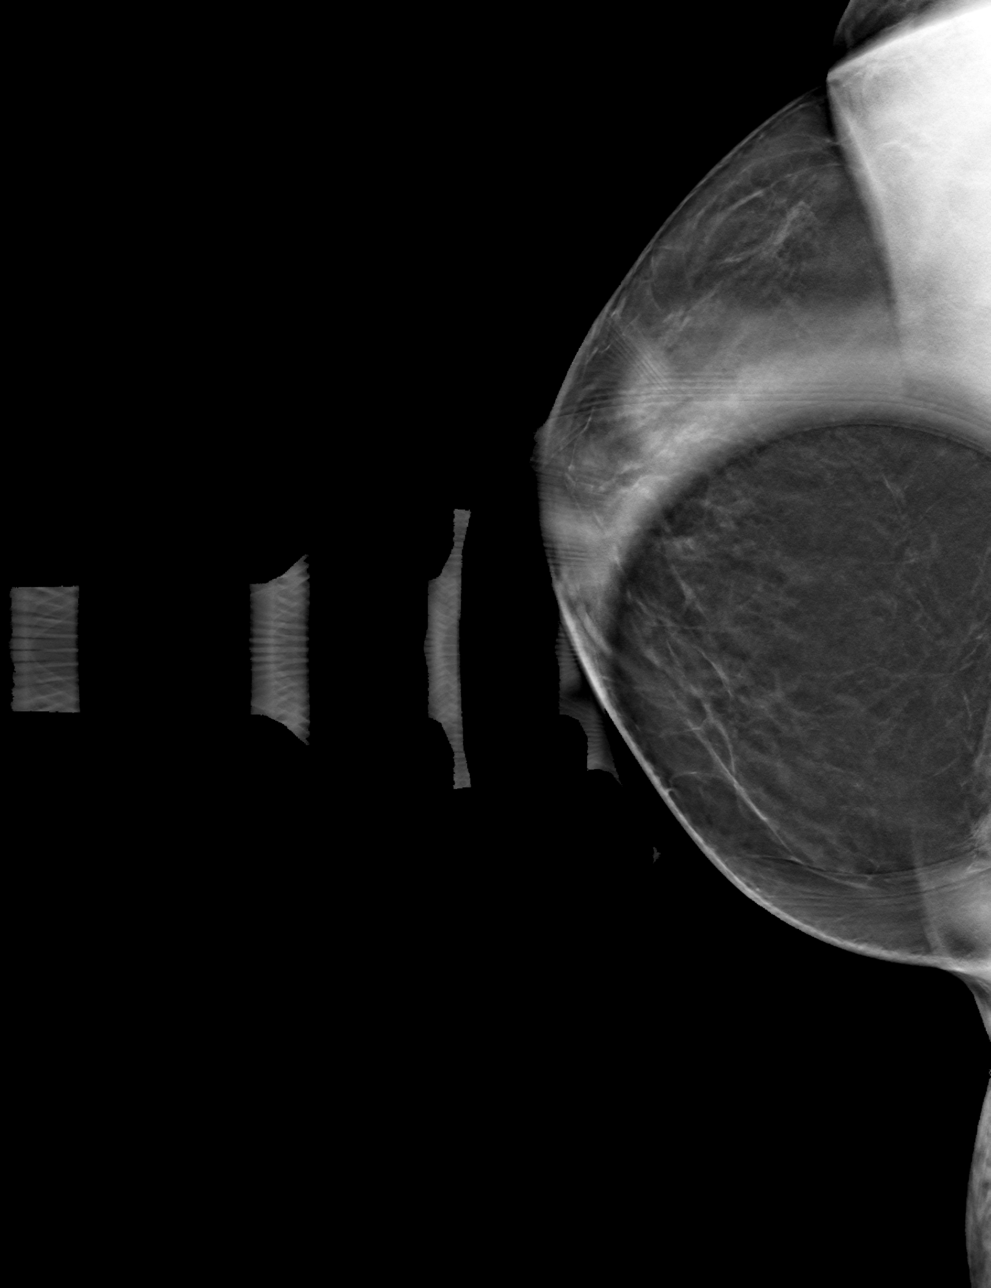

[R ML tomo · tomo slice 31/61.0]
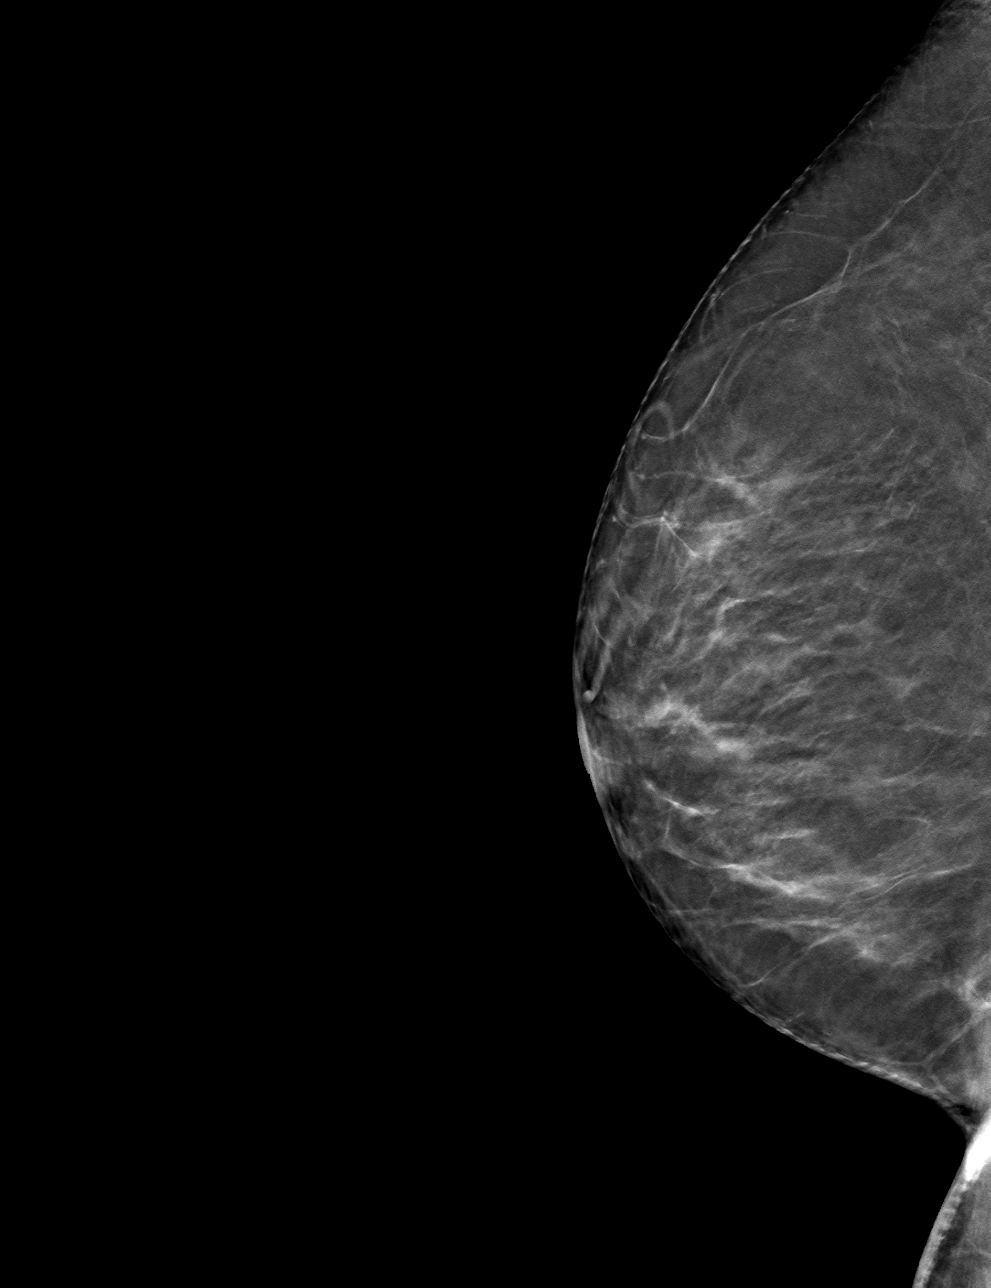

[4 of 12 positions shown; findings below may reference images not displayed]

ACR Breast Density Category b: There are scattered areas of
fibroglandular density.
FINDINGS: Additional 2-D and 3-D images are performed. These views show a
persistent asymmetry in the posteromedial aspect of the RIGHT
breast, consistent with sternalis muscle, a variation of normal.

Mammographic images were processed with CAD.
IMPRESSION: No mammographic or ultrasound evidence for malignancy. Sternalis
muscle accounts for the mammographic abnormality.

RECOMMENDATION:
Screening mammogram in one year.(Code:K3-F-TUF)

I have discussed the findings and recommendations with the patient.
Results were also provided in writing at the conclusion of the
visit. If applicable, a reminder letter will be sent to the patient
regarding the next appointment.

BI-RADS CATEGORY  2: Benign.

## 2019-07-10 DIAGNOSIS — M1990 Unspecified osteoarthritis, unspecified site: Secondary | ICD-10-CM | POA: Diagnosis not present

## 2019-07-10 DIAGNOSIS — R202 Paresthesia of skin: Secondary | ICD-10-CM | POA: Diagnosis not present

## 2019-07-10 DIAGNOSIS — M654 Radial styloid tenosynovitis [de Quervain]: Secondary | ICD-10-CM | POA: Diagnosis not present

## 2019-07-10 DIAGNOSIS — J45909 Unspecified asthma, uncomplicated: Secondary | ICD-10-CM | POA: Diagnosis not present

## 2019-07-10 DIAGNOSIS — R69 Illness, unspecified: Secondary | ICD-10-CM | POA: Diagnosis not present

## 2019-07-10 DIAGNOSIS — R928 Other abnormal and inconclusive findings on diagnostic imaging of breast: Secondary | ICD-10-CM | POA: Diagnosis not present

## 2019-07-10 DIAGNOSIS — J4541 Moderate persistent asthma with (acute) exacerbation: Secondary | ICD-10-CM | POA: Diagnosis not present

## 2019-07-10 DIAGNOSIS — I10 Essential (primary) hypertension: Secondary | ICD-10-CM | POA: Diagnosis not present

## 2019-07-10 DIAGNOSIS — E1165 Type 2 diabetes mellitus with hyperglycemia: Secondary | ICD-10-CM | POA: Diagnosis not present

## 2019-07-10 DIAGNOSIS — E782 Mixed hyperlipidemia: Secondary | ICD-10-CM | POA: Diagnosis not present

## 2019-10-27 DIAGNOSIS — Z23 Encounter for immunization: Secondary | ICD-10-CM | POA: Diagnosis not present

## 2019-10-27 DIAGNOSIS — M76899 Other specified enthesopathies of unspecified lower limb, excluding foot: Secondary | ICD-10-CM | POA: Diagnosis not present

## 2019-10-27 DIAGNOSIS — Z6828 Body mass index (BMI) 28.0-28.9, adult: Secondary | ICD-10-CM | POA: Diagnosis not present

## 2019-11-05 DIAGNOSIS — M1711 Unilateral primary osteoarthritis, right knee: Secondary | ICD-10-CM | POA: Diagnosis not present

## 2019-11-05 DIAGNOSIS — M25561 Pain in right knee: Secondary | ICD-10-CM | POA: Diagnosis not present

## 2019-12-03 DIAGNOSIS — R69 Illness, unspecified: Secondary | ICD-10-CM | POA: Diagnosis not present

## 2019-12-04 DIAGNOSIS — H5213 Myopia, bilateral: Secondary | ICD-10-CM | POA: Diagnosis not present

## 2019-12-09 DIAGNOSIS — I10 Essential (primary) hypertension: Secondary | ICD-10-CM | POA: Diagnosis not present

## 2019-12-10 DIAGNOSIS — E559 Vitamin D deficiency, unspecified: Secondary | ICD-10-CM | POA: Diagnosis not present

## 2019-12-10 DIAGNOSIS — Z6827 Body mass index (BMI) 27.0-27.9, adult: Secondary | ICD-10-CM | POA: Diagnosis not present

## 2019-12-10 DIAGNOSIS — E039 Hypothyroidism, unspecified: Secondary | ICD-10-CM | POA: Diagnosis not present

## 2019-12-10 DIAGNOSIS — E7800 Pure hypercholesterolemia, unspecified: Secondary | ICD-10-CM | POA: Diagnosis not present

## 2019-12-10 DIAGNOSIS — M810 Age-related osteoporosis without current pathological fracture: Secondary | ICD-10-CM | POA: Diagnosis not present

## 2019-12-10 DIAGNOSIS — D649 Anemia, unspecified: Secondary | ICD-10-CM | POA: Diagnosis not present

## 2019-12-22 DIAGNOSIS — D1801 Hemangioma of skin and subcutaneous tissue: Secondary | ICD-10-CM | POA: Diagnosis not present

## 2019-12-22 DIAGNOSIS — L821 Other seborrheic keratosis: Secondary | ICD-10-CM | POA: Diagnosis not present

## 2019-12-22 DIAGNOSIS — D225 Melanocytic nevi of trunk: Secondary | ICD-10-CM | POA: Diagnosis not present

## 2019-12-22 DIAGNOSIS — L814 Other melanin hyperpigmentation: Secondary | ICD-10-CM | POA: Diagnosis not present

## 2019-12-22 DIAGNOSIS — L570 Actinic keratosis: Secondary | ICD-10-CM | POA: Diagnosis not present

## 2019-12-22 DIAGNOSIS — Z85828 Personal history of other malignant neoplasm of skin: Secondary | ICD-10-CM | POA: Diagnosis not present

## 2020-01-10 DIAGNOSIS — R69 Illness, unspecified: Secondary | ICD-10-CM | POA: Diagnosis not present

## 2020-02-11 ENCOUNTER — Other Ambulatory Visit: Payer: Self-pay | Admitting: Nurse Practitioner

## 2020-02-11 DIAGNOSIS — Z1231 Encounter for screening mammogram for malignant neoplasm of breast: Secondary | ICD-10-CM

## 2020-03-11 ENCOUNTER — Ambulatory Visit
Admission: RE | Admit: 2020-03-11 | Discharge: 2020-03-11 | Disposition: A | Discharge status: Home / Self Care | Payer: Medicare HMO | Source: Ambulatory Visit | Attending: Nurse Practitioner | Admitting: Nurse Practitioner

## 2020-03-11 ENCOUNTER — Other Ambulatory Visit: Payer: Self-pay

## 2020-03-11 DIAGNOSIS — Z1231 Encounter for screening mammogram for malignant neoplasm of breast: Secondary | ICD-10-CM

## 2020-04-25 ENCOUNTER — Encounter: Payer: Self-pay | Admitting: Nurse Practitioner

## 2020-05-17 ENCOUNTER — Ambulatory Visit: Payer: Medicare HMO | Admitting: Nurse Practitioner

## 2020-05-31 ENCOUNTER — Encounter: Payer: Self-pay | Admitting: Nurse Practitioner

## 2020-05-31 ENCOUNTER — Ambulatory Visit: Payer: Medicare HMO | Admitting: Nurse Practitioner

## 2020-05-31 ENCOUNTER — Other Ambulatory Visit: Payer: Medicare HMO

## 2020-05-31 VITALS — BP 120/88 | HR 62 | Ht 65.0 in | Wt 166.2 lb

## 2020-05-31 DIAGNOSIS — R194 Change in bowel habit: Secondary | ICD-10-CM

## 2020-05-31 NOTE — Progress Notes (Signed)
I agree with the above note, plan 

## 2020-05-31 NOTE — Progress Notes (Signed)
ASSESSMENT AND PLAN    # 71 yo female with bowel changes with increased frequency of semi-solid stools. Also with bloating and excessive flatulence. Extensive stools studies by PCP are negative.  --Patient has been able to identify some foods which exacerbate her symptoms. I have asked her to eliminate diary and artificial sweeteners from her diet over the next few weeks to see if there is any improvement.  --tTg, IgA. --Trial of xifaxan ( or flagyl) for possible SIBO if no improvement after dietary adjustments --Negative Cologuard Feb 2021 noted but she may come to diagnostic colonoscopy at some point if symptoms persist   # Colon cancer screening --Negative Cologuard Feb 2021.   # Hypothyroidism.  --Patient says she is current with thyroid studies.   HISTORY OF PRESENT ILLNESS     Primary Gastroenterologist : new - Oretha Caprice, MD      Chief Complaint : bowel changes  Traci Washington is a 70 y.o. female with PMH / [place] significant for,  but not necessarily limited to: Hypothyroidism, hyperlipidemia, basal cell skin cancer  Traci Washington is referred by PCP for evaluation of diarrhea. Historically patient has had a solid stool 1-2 times a day. In mid August she developed non-bloody diarrhea having ~ two loose BMs a day.  She had COVID and Shingles vaccines two weeks prior to onset of bowel changes but no medication or dietary changes. She doesn't recall being sick with any GI illnesses prior to onset of symptoms.   The diarrhea resolved in September / October but BMs have not returned to baseline and she has been having bloating and excessive flatulence. Presently she has a large semi-solid BM 1-2 times a day. In between bowel movements she passes smaller pieces of semi-solid stool a few times a day. At times she has been incontinent of fecal debris with passage of gas. She has noticed that some foods such as grapes, ice cream and peanut butter will aggravate her symptoms leading to  more urgency, increased gas as well as generalized abdominal discomfort.   She drinks diet soda but only on weekends. She chews sugar free gum. She uses a non-diary coffee creamer but thinks it could contain an artifical sweetner.  Other than the bowel changes she feels okay. No nausea / vomiting. Her weight is stable. No fevers. She believes her thyroid levels have been checked within the last several months.     Data Reviewed: Stool WBCs negative, stool qualitative fat negative, stool culture negative, C. difficile negative Cryptosporidium and Giardia negative, O&P negative  Previous Endoscopic Evaluations / Pertinent Studies:  06/27/2019 Cologuard negative   Past Medical History:  Diagnosis Date   Basal cell carcinoma of skin    High cholesterol    Hypothyroid    Osteoporosis    Shoulder injury      Past Surgical History:  Procedure Laterality Date   CATARACT EXTRACTION     TONSILLECTOMY     Family History  Problem Relation Age of Onset   COPD Mother    Osteoporosis Mother    Heart disease Father    High blood pressure Father    Diabetes Brother    Colon cancer Maternal Uncle    Healthy Brother    Liver disease Neg Hx    Stomach cancer Neg Hx    Pancreatic cancer Neg Hx    Esophageal cancer Neg Hx    Social History   Tobacco Use   Smoking status: Former Smoker  Types: Cigarettes    Quit date: 1980    Years since quitting: 42.1   Smokeless tobacco: Never Used  Scientific laboratory technician Use: Never used  Substance Use Topics   Alcohol use: Yes    Comment: occ   Drug use: Never   Current Outpatient Medications  Medication Sig Dispense Refill   alendronate (FOSAMAX) 70 MG tablet Take 70 mg by mouth once a week. Take with a full glass of water on an empty stomach.     Calcium Carb-Cholecalciferol (CALTRATE 600+D3 PO) Take 1 tablet by mouth daily.     Cholecalciferol (VITAMIN D-3 PO) Take 1,000 Int'l Units/day by mouth daily.     CINNAMON  PO Take 1,000 mg by mouth 2 (two) times daily.     fluorouracil (EFUDEX) 5 % cream Apply 1 application topically as needed.     levothyroxine (SYNTHROID) 50 MCG tablet Take 50 mcg by mouth daily before breakfast.     MULTIPLE VITAMINS PO Take 1 tablet by mouth daily. One a day women's     pravastatin (PRAVACHOL) 40 MG tablet Take 40 mg by mouth daily.     Probiotic Product (PROBIOTIC ADVANCED PO) Take 1 tablet by mouth daily.     No current facility-administered medications for this visit.   No Known Allergies   Review of Systems: All systems reviewed and negative except where noted in HPI.   PHYSICAL EXAM :    Wt Readings from Last 3 Encounters:  05/31/20 166 lb 3.2 oz (75.4 kg)    BP 120/88    Pulse 62    Ht 5\' 5"  (1.651 m)    Wt 166 lb 3.2 oz (75.4 kg)    SpO2 98%    BMI 27.66 kg/m  Constitutional:  Pleasant female in no acute distress. Psychiatric: Normal mood and affect. Behavior is normal. EENT: Pupils normal.  Conjunctivae are normal. No scleral icterus. Neck supple.  Cardiovascular: Normal rate, regular rhythm. No edema Pulmonary/chest: Effort normal and breath sounds normal. No wheezing, rales or rhonchi. Abdominal: Soft, nondistended, nontender. Bowel sounds active throughout. There are no masses palpable. No hepatomegaly. Neurological: Alert and oriented to person place and time. Skin: Skin is warm and dry. No rashes noted.  [name], NP  05/31/2020, 1:37 PM  Cc:  Referring Provider Philmore Pali, NP

## 2020-05-31 NOTE — Patient Instructions (Signed)
Keep a food diary for the next 3-4 weeks.   Eliminate all diary and artifical sweeteners for the next few weeks.   Your provider has requested that you go to the basement level for lab work before leaving today. Press "B" on the elevator. The lab is located at the first door on the left as you exit the elevator.  Call office in 3 weeks with an update on your symptoms. Ask for my nurse Beth.  If you are age 68 or older, your body mass index should be between 23-30. Your Body mass index is 27.66 kg/m. If this is out of the aforementioned range listed, please consider follow up with your Primary Care Provider.  Thank you for choosing me and [facility].  [name] NP

## 2020-06-01 LAB — IGA: Immunoglobulin A: 309 mg/dL (ref 70–320)

## 2020-06-01 LAB — TISSUE TRANSGLUTAMINASE, IGA: (tTG) Ab, IgA: <1 U/mL

## 2020-06-17 DIAGNOSIS — E039 Hypothyroidism, unspecified: Secondary | ICD-10-CM | POA: Diagnosis not present

## 2020-06-17 DIAGNOSIS — Z1331 Encounter for screening for depression: Secondary | ICD-10-CM | POA: Diagnosis not present

## 2020-06-17 DIAGNOSIS — M810 Age-related osteoporosis without current pathological fracture: Secondary | ICD-10-CM | POA: Diagnosis not present

## 2020-06-17 DIAGNOSIS — Z6827 Body mass index (BMI) 27.0-27.9, adult: Secondary | ICD-10-CM | POA: Diagnosis not present

## 2020-06-17 DIAGNOSIS — Z9181 History of falling: Secondary | ICD-10-CM | POA: Diagnosis not present

## 2020-06-17 DIAGNOSIS — E559 Vitamin D deficiency, unspecified: Secondary | ICD-10-CM | POA: Diagnosis not present

## 2020-06-17 DIAGNOSIS — E7800 Pure hypercholesterolemia, unspecified: Secondary | ICD-10-CM | POA: Diagnosis not present

## 2020-06-17 DIAGNOSIS — Z139 Encounter for screening, unspecified: Secondary | ICD-10-CM | POA: Diagnosis not present

## 2020-06-17 DIAGNOSIS — Z6826 Body mass index (BMI) 26.0-26.9, adult: Secondary | ICD-10-CM | POA: Diagnosis not present

## 2020-06-17 DIAGNOSIS — D649 Anemia, unspecified: Secondary | ICD-10-CM | POA: Diagnosis not present

## 2020-11-04 DIAGNOSIS — Z825 Family history of asthma and other chronic lower respiratory diseases: Secondary | ICD-10-CM | POA: Diagnosis not present

## 2020-11-04 DIAGNOSIS — M810 Age-related osteoporosis without current pathological fracture: Secondary | ICD-10-CM | POA: Diagnosis not present

## 2020-11-04 DIAGNOSIS — R030 Elevated blood-pressure reading, without diagnosis of hypertension: Secondary | ICD-10-CM | POA: Diagnosis not present

## 2020-11-04 DIAGNOSIS — E785 Hyperlipidemia, unspecified: Secondary | ICD-10-CM | POA: Diagnosis not present

## 2020-11-04 DIAGNOSIS — Z833 Family history of diabetes mellitus: Secondary | ICD-10-CM | POA: Diagnosis not present

## 2020-11-04 DIAGNOSIS — Z7983 Long term (current) use of bisphosphonates: Secondary | ICD-10-CM | POA: Diagnosis not present

## 2020-11-04 DIAGNOSIS — Z7722 Contact with and (suspected) exposure to environmental tobacco smoke (acute) (chronic): Secondary | ICD-10-CM | POA: Diagnosis not present

## 2020-11-04 DIAGNOSIS — Z8249 Family history of ischemic heart disease and other diseases of the circulatory system: Secondary | ICD-10-CM | POA: Diagnosis not present

## 2020-11-04 DIAGNOSIS — E039 Hypothyroidism, unspecified: Secondary | ICD-10-CM | POA: Diagnosis not present

## 2020-11-04 DIAGNOSIS — Z85828 Personal history of other malignant neoplasm of skin: Secondary | ICD-10-CM | POA: Diagnosis not present

## 2020-11-11 DIAGNOSIS — Z961 Presence of intraocular lens: Secondary | ICD-10-CM | POA: Diagnosis not present

## 2021-01-06 DIAGNOSIS — Z6826 Body mass index (BMI) 26.0-26.9, adult: Secondary | ICD-10-CM | POA: Diagnosis not present

## 2021-01-06 DIAGNOSIS — Z0000 Encounter for general adult medical examination without abnormal findings: Secondary | ICD-10-CM | POA: Diagnosis not present

## 2021-01-06 DIAGNOSIS — Z23 Encounter for immunization: Secondary | ICD-10-CM | POA: Diagnosis not present

## 2021-01-06 DIAGNOSIS — E039 Hypothyroidism, unspecified: Secondary | ICD-10-CM | POA: Diagnosis not present

## 2021-01-06 DIAGNOSIS — Z1231 Encounter for screening mammogram for malignant neoplasm of breast: Secondary | ICD-10-CM | POA: Diagnosis not present

## 2021-01-06 DIAGNOSIS — E7800 Pure hypercholesterolemia, unspecified: Secondary | ICD-10-CM | POA: Diagnosis not present

## 2021-01-06 DIAGNOSIS — M810 Age-related osteoporosis without current pathological fracture: Secondary | ICD-10-CM | POA: Diagnosis not present

## 2021-01-10 ENCOUNTER — Other Ambulatory Visit: Payer: Self-pay | Admitting: Nurse Practitioner

## 2021-01-10 DIAGNOSIS — R5381 Other malaise: Secondary | ICD-10-CM

## 2021-01-13 ENCOUNTER — Other Ambulatory Visit: Payer: Self-pay | Admitting: Nurse Practitioner

## 2021-01-13 DIAGNOSIS — M810 Age-related osteoporosis without current pathological fracture: Secondary | ICD-10-CM

## 2021-01-23 ENCOUNTER — Other Ambulatory Visit: Payer: Self-pay | Admitting: Nurse Practitioner

## 2021-01-23 DIAGNOSIS — Z1231 Encounter for screening mammogram for malignant neoplasm of breast: Secondary | ICD-10-CM

## 2021-02-22 DIAGNOSIS — L570 Actinic keratosis: Secondary | ICD-10-CM | POA: Diagnosis not present

## 2021-02-22 DIAGNOSIS — L821 Other seborrheic keratosis: Secondary | ICD-10-CM | POA: Diagnosis not present

## 2021-02-22 DIAGNOSIS — Z85828 Personal history of other malignant neoplasm of skin: Secondary | ICD-10-CM | POA: Diagnosis not present

## 2021-02-22 DIAGNOSIS — L814 Other melanin hyperpigmentation: Secondary | ICD-10-CM | POA: Diagnosis not present

## 2021-03-17 ENCOUNTER — Ambulatory Visit
Admission: RE | Admit: 2021-03-17 | Discharge: 2021-03-17 | Disposition: A | Discharge status: Home / Self Care | Payer: Medicare HMO | Source: Ambulatory Visit | Attending: Nurse Practitioner | Admitting: Nurse Practitioner

## 2021-03-17 ENCOUNTER — Other Ambulatory Visit: Payer: Self-pay

## 2021-03-17 DIAGNOSIS — Z1231 Encounter for screening mammogram for malignant neoplasm of breast: Secondary | ICD-10-CM | POA: Diagnosis not present

## 2021-07-07 ENCOUNTER — Other Ambulatory Visit: Payer: Self-pay

## 2021-07-07 ENCOUNTER — Ambulatory Visit
Admission: RE | Admit: 2021-07-07 | Discharge: 2021-07-07 | Disposition: A | Discharge status: Home / Self Care | Payer: Medicare HMO | Source: Ambulatory Visit | Attending: Nurse Practitioner | Admitting: Nurse Practitioner

## 2021-07-07 DIAGNOSIS — M810 Age-related osteoporosis without current pathological fracture: Secondary | ICD-10-CM | POA: Diagnosis not present

## 2021-07-07 DIAGNOSIS — Z780 Asymptomatic menopausal state: Secondary | ICD-10-CM | POA: Diagnosis not present

## 2021-07-11 DIAGNOSIS — Z9181 History of falling: Secondary | ICD-10-CM | POA: Diagnosis not present

## 2021-07-11 DIAGNOSIS — Z6827 Body mass index (BMI) 27.0-27.9, adult: Secondary | ICD-10-CM | POA: Diagnosis not present

## 2021-07-11 DIAGNOSIS — Z79899 Other long term (current) drug therapy: Secondary | ICD-10-CM | POA: Diagnosis not present

## 2021-07-11 DIAGNOSIS — E7800 Pure hypercholesterolemia, unspecified: Secondary | ICD-10-CM | POA: Diagnosis not present

## 2021-07-11 DIAGNOSIS — M810 Age-related osteoporosis without current pathological fracture: Secondary | ICD-10-CM | POA: Diagnosis not present

## 2021-07-11 DIAGNOSIS — Z139 Encounter for screening, unspecified: Secondary | ICD-10-CM | POA: Diagnosis not present

## 2021-07-11 DIAGNOSIS — Z1331 Encounter for screening for depression: Secondary | ICD-10-CM | POA: Diagnosis not present

## 2021-07-11 DIAGNOSIS — E559 Vitamin D deficiency, unspecified: Secondary | ICD-10-CM | POA: Diagnosis not present

## 2021-07-11 DIAGNOSIS — E039 Hypothyroidism, unspecified: Secondary | ICD-10-CM | POA: Diagnosis not present

## 2021-08-25 DIAGNOSIS — M810 Age-related osteoporosis without current pathological fracture: Secondary | ICD-10-CM | POA: Diagnosis not present

## 2021-11-14 DIAGNOSIS — Z961 Presence of intraocular lens: Secondary | ICD-10-CM | POA: Diagnosis not present

## 2021-11-14 DIAGNOSIS — H524 Presbyopia: Secondary | ICD-10-CM | POA: Diagnosis not present

## 2022-01-11 DIAGNOSIS — E039 Hypothyroidism, unspecified: Secondary | ICD-10-CM | POA: Diagnosis not present

## 2022-01-11 DIAGNOSIS — Z79899 Other long term (current) drug therapy: Secondary | ICD-10-CM | POA: Diagnosis not present

## 2022-01-11 DIAGNOSIS — M810 Age-related osteoporosis without current pathological fracture: Secondary | ICD-10-CM | POA: Diagnosis not present

## 2022-01-11 DIAGNOSIS — E559 Vitamin D deficiency, unspecified: Secondary | ICD-10-CM | POA: Diagnosis not present

## 2022-01-11 DIAGNOSIS — E7800 Pure hypercholesterolemia, unspecified: Secondary | ICD-10-CM | POA: Diagnosis not present

## 2022-02-13 ENCOUNTER — Other Ambulatory Visit: Payer: Self-pay | Admitting: Family Medicine

## 2022-02-13 DIAGNOSIS — Z1231 Encounter for screening mammogram for malignant neoplasm of breast: Secondary | ICD-10-CM

## 2022-02-26 DIAGNOSIS — M810 Age-related osteoporosis without current pathological fracture: Secondary | ICD-10-CM | POA: Diagnosis not present

## 2022-02-28 DIAGNOSIS — D1801 Hemangioma of skin and subcutaneous tissue: Secondary | ICD-10-CM | POA: Diagnosis not present

## 2022-02-28 DIAGNOSIS — L565 Disseminated superficial actinic porokeratosis (DSAP): Secondary | ICD-10-CM | POA: Diagnosis not present

## 2022-02-28 DIAGNOSIS — Z85828 Personal history of other malignant neoplasm of skin: Secondary | ICD-10-CM | POA: Diagnosis not present

## 2022-02-28 DIAGNOSIS — L821 Other seborrheic keratosis: Secondary | ICD-10-CM | POA: Diagnosis not present

## 2022-02-28 DIAGNOSIS — L814 Other melanin hyperpigmentation: Secondary | ICD-10-CM | POA: Diagnosis not present

## 2022-02-28 DIAGNOSIS — L218 Other seborrheic dermatitis: Secondary | ICD-10-CM | POA: Diagnosis not present

## 2022-04-02 ENCOUNTER — Ambulatory Visit
Admission: RE | Admit: 2022-04-02 | Discharge: 2022-04-02 | Disposition: A | Discharge status: Home / Self Care | Payer: Medicare HMO | Source: Ambulatory Visit | Attending: Family Medicine | Admitting: Family Medicine

## 2022-04-02 ENCOUNTER — Ambulatory Visit: Payer: Medicare HMO

## 2022-04-02 DIAGNOSIS — Z1231 Encounter for screening mammogram for malignant neoplasm of breast: Secondary | ICD-10-CM | POA: Diagnosis not present

## 2022-07-16 DIAGNOSIS — E7800 Pure hypercholesterolemia, unspecified: Secondary | ICD-10-CM | POA: Diagnosis not present

## 2022-07-16 DIAGNOSIS — Z1211 Encounter for screening for malignant neoplasm of colon: Secondary | ICD-10-CM | POA: Diagnosis not present

## 2022-07-16 DIAGNOSIS — Z139 Encounter for screening, unspecified: Secondary | ICD-10-CM | POA: Diagnosis not present

## 2022-07-16 DIAGNOSIS — M25512 Pain in left shoulder: Secondary | ICD-10-CM | POA: Diagnosis not present

## 2022-07-16 DIAGNOSIS — Z9181 History of falling: Secondary | ICD-10-CM | POA: Diagnosis not present

## 2022-07-16 DIAGNOSIS — E039 Hypothyroidism, unspecified: Secondary | ICD-10-CM | POA: Diagnosis not present

## 2022-07-16 DIAGNOSIS — M810 Age-related osteoporosis without current pathological fracture: Secondary | ICD-10-CM | POA: Diagnosis not present

## 2022-07-16 DIAGNOSIS — M25511 Pain in right shoulder: Secondary | ICD-10-CM | POA: Diagnosis not present

## 2022-07-16 DIAGNOSIS — E559 Vitamin D deficiency, unspecified: Secondary | ICD-10-CM | POA: Diagnosis not present

## 2022-07-16 DIAGNOSIS — Z79899 Other long term (current) drug therapy: Secondary | ICD-10-CM | POA: Diagnosis not present

## 2022-07-16 DIAGNOSIS — Z1331 Encounter for screening for depression: Secondary | ICD-10-CM | POA: Diagnosis not present

## 2022-07-31 DIAGNOSIS — Z1211 Encounter for screening for malignant neoplasm of colon: Secondary | ICD-10-CM | POA: Diagnosis not present

## 2022-07-31 DIAGNOSIS — Z1212 Encounter for screening for malignant neoplasm of rectum: Secondary | ICD-10-CM | POA: Diagnosis not present

## 2022-08-04 LAB — COLOGUARD: COLOGUARD: NEGATIVE

## 2022-08-04 LAB — EXTERNAL GENERIC LAB PROCEDURE: COLOGUARD: NEGATIVE

## 2022-08-27 DIAGNOSIS — L821 Other seborrheic keratosis: Secondary | ICD-10-CM | POA: Diagnosis not present

## 2022-08-27 DIAGNOSIS — D0439 Carcinoma in situ of skin of other parts of face: Secondary | ICD-10-CM | POA: Diagnosis not present

## 2022-08-27 DIAGNOSIS — L720 Epidermal cyst: Secondary | ICD-10-CM | POA: Diagnosis not present

## 2022-08-30 DIAGNOSIS — M810 Age-related osteoporosis without current pathological fracture: Secondary | ICD-10-CM | POA: Diagnosis not present

## 2022-11-23 DIAGNOSIS — Z961 Presence of intraocular lens: Secondary | ICD-10-CM | POA: Diagnosis not present

## 2022-11-23 DIAGNOSIS — H524 Presbyopia: Secondary | ICD-10-CM | POA: Diagnosis not present

## 2023-01-22 DIAGNOSIS — E039 Hypothyroidism, unspecified: Secondary | ICD-10-CM | POA: Diagnosis not present

## 2023-01-22 DIAGNOSIS — M810 Age-related osteoporosis without current pathological fracture: Secondary | ICD-10-CM | POA: Diagnosis not present

## 2023-01-22 DIAGNOSIS — Z79899 Other long term (current) drug therapy: Secondary | ICD-10-CM | POA: Diagnosis not present

## 2023-01-22 DIAGNOSIS — Z139 Encounter for screening, unspecified: Secondary | ICD-10-CM | POA: Diagnosis not present

## 2023-01-22 DIAGNOSIS — E559 Vitamin D deficiency, unspecified: Secondary | ICD-10-CM | POA: Diagnosis not present

## 2023-01-22 DIAGNOSIS — E7800 Pure hypercholesterolemia, unspecified: Secondary | ICD-10-CM | POA: Diagnosis not present

## 2023-02-22 ENCOUNTER — Other Ambulatory Visit: Payer: Self-pay | Admitting: Family Medicine

## 2023-02-22 DIAGNOSIS — Z1231 Encounter for screening mammogram for malignant neoplasm of breast: Secondary | ICD-10-CM

## 2023-03-04 DIAGNOSIS — Z85828 Personal history of other malignant neoplasm of skin: Secondary | ICD-10-CM | POA: Diagnosis not present

## 2023-03-04 DIAGNOSIS — L814 Other melanin hyperpigmentation: Secondary | ICD-10-CM | POA: Diagnosis not present

## 2023-03-04 DIAGNOSIS — L718 Other rosacea: Secondary | ICD-10-CM | POA: Diagnosis not present

## 2023-03-04 DIAGNOSIS — L821 Other seborrheic keratosis: Secondary | ICD-10-CM | POA: Diagnosis not present

## 2023-04-05 ENCOUNTER — Ambulatory Visit
Admission: RE | Admit: 2023-04-05 | Discharge: 2023-04-05 | Disposition: A | Discharge status: Home / Self Care | Payer: Medicare HMO | Source: Ambulatory Visit | Attending: Family Medicine | Admitting: Family Medicine

## 2023-04-05 DIAGNOSIS — Z1231 Encounter for screening mammogram for malignant neoplasm of breast: Secondary | ICD-10-CM | POA: Diagnosis not present

## 2023-07-23 ENCOUNTER — Other Ambulatory Visit: Payer: Self-pay | Admitting: Family Medicine

## 2023-07-23 DIAGNOSIS — E039 Hypothyroidism, unspecified: Secondary | ICD-10-CM | POA: Diagnosis not present

## 2023-07-23 DIAGNOSIS — E559 Vitamin D deficiency, unspecified: Secondary | ICD-10-CM | POA: Diagnosis not present

## 2023-07-23 DIAGNOSIS — Z9181 History of falling: Secondary | ICD-10-CM | POA: Diagnosis not present

## 2023-07-23 DIAGNOSIS — M810 Age-related osteoporosis without current pathological fracture: Secondary | ICD-10-CM | POA: Diagnosis not present

## 2023-07-23 DIAGNOSIS — Z1331 Encounter for screening for depression: Secondary | ICD-10-CM | POA: Diagnosis not present

## 2023-07-23 DIAGNOSIS — L659 Nonscarring hair loss, unspecified: Secondary | ICD-10-CM | POA: Diagnosis not present

## 2023-07-23 DIAGNOSIS — Z79899 Other long term (current) drug therapy: Secondary | ICD-10-CM | POA: Diagnosis not present

## 2023-07-23 DIAGNOSIS — E7800 Pure hypercholesterolemia, unspecified: Secondary | ICD-10-CM | POA: Diagnosis not present

## 2023-08-13 DIAGNOSIS — Z79899 Other long term (current) drug therapy: Secondary | ICD-10-CM | POA: Diagnosis not present

## 2023-08-28 DIAGNOSIS — Z79899 Other long term (current) drug therapy: Secondary | ICD-10-CM | POA: Diagnosis not present

## 2023-11-25 DIAGNOSIS — H04123 Dry eye syndrome of bilateral lacrimal glands: Secondary | ICD-10-CM | POA: Diagnosis not present

## 2023-11-25 DIAGNOSIS — H524 Presbyopia: Secondary | ICD-10-CM | POA: Diagnosis not present

## 2023-11-25 DIAGNOSIS — H35413 Lattice degeneration of retina, bilateral: Secondary | ICD-10-CM | POA: Diagnosis not present

## 2023-12-19 ENCOUNTER — Ambulatory Visit (HOSPITAL_BASED_OUTPATIENT_CLINIC_OR_DEPARTMENT_OTHER)
Admission: RE | Admit: 2023-12-19 | Discharge: 2023-12-19 | Disposition: A | Discharge status: Home / Self Care | Source: Ambulatory Visit | Attending: Family Medicine | Admitting: Family Medicine

## 2023-12-19 DIAGNOSIS — M810 Age-related osteoporosis without current pathological fracture: Secondary | ICD-10-CM | POA: Insufficient documentation

## 2023-12-19 DIAGNOSIS — Z780 Asymptomatic menopausal state: Secondary | ICD-10-CM | POA: Diagnosis not present

## 2024-01-29 DIAGNOSIS — M810 Age-related osteoporosis without current pathological fracture: Secondary | ICD-10-CM | POA: Diagnosis not present

## 2024-01-29 DIAGNOSIS — E7800 Pure hypercholesterolemia, unspecified: Secondary | ICD-10-CM | POA: Diagnosis not present

## 2024-01-29 DIAGNOSIS — Z139 Encounter for screening, unspecified: Secondary | ICD-10-CM | POA: Diagnosis not present

## 2024-01-29 DIAGNOSIS — Z79899 Other long term (current) drug therapy: Secondary | ICD-10-CM | POA: Diagnosis not present

## 2024-01-29 DIAGNOSIS — E559 Vitamin D deficiency, unspecified: Secondary | ICD-10-CM | POA: Diagnosis not present

## 2024-01-29 DIAGNOSIS — E039 Hypothyroidism, unspecified: Secondary | ICD-10-CM | POA: Diagnosis not present

## 2024-02-21 ENCOUNTER — Other Ambulatory Visit: Payer: Self-pay | Admitting: Family Medicine

## 2024-02-21 DIAGNOSIS — Z1231 Encounter for screening mammogram for malignant neoplasm of breast: Secondary | ICD-10-CM

## 2024-03-03 DIAGNOSIS — L814 Other melanin hyperpigmentation: Secondary | ICD-10-CM | POA: Diagnosis not present

## 2024-03-03 DIAGNOSIS — C44511 Basal cell carcinoma of skin of breast: Secondary | ICD-10-CM | POA: Diagnosis not present

## 2024-03-03 DIAGNOSIS — Z85828 Personal history of other malignant neoplasm of skin: Secondary | ICD-10-CM | POA: Diagnosis not present

## 2024-03-03 DIAGNOSIS — C44519 Basal cell carcinoma of skin of other part of trunk: Secondary | ICD-10-CM | POA: Diagnosis not present

## 2024-03-03 DIAGNOSIS — L821 Other seborrheic keratosis: Secondary | ICD-10-CM | POA: Diagnosis not present

## 2024-03-31 ENCOUNTER — Other Ambulatory Visit

## 2024-04-06 ENCOUNTER — Ambulatory Visit
Admission: RE | Admit: 2024-04-06 | Discharge: 2024-04-06 | Disposition: A | Source: Ambulatory Visit | Attending: Family Medicine | Admitting: Family Medicine

## 2024-04-06 DIAGNOSIS — Z1231 Encounter for screening mammogram for malignant neoplasm of breast: Secondary | ICD-10-CM
